# Patient Record
Sex: Female | Born: 1970 | Race: White | Hispanic: No | Marital: Married | Smoking: Former smoker
Health system: Southern US, Community
[De-identification: ages and names within clinical notes are randomized; demographics above are authoritative.]

## PROBLEM LIST (undated history)

## (undated) DIAGNOSIS — K429 Umbilical hernia without obstruction or gangrene: Secondary | ICD-10-CM

## (undated) DIAGNOSIS — F99 Mental disorder, not otherwise specified: Secondary | ICD-10-CM

## (undated) DIAGNOSIS — F419 Anxiety disorder, unspecified: Secondary | ICD-10-CM

## (undated) DIAGNOSIS — N83209 Unspecified ovarian cyst, unspecified side: Secondary | ICD-10-CM

## (undated) DIAGNOSIS — IMO0002 Reserved for concepts with insufficient information to code with codable children: Secondary | ICD-10-CM

## (undated) DIAGNOSIS — N3010 Interstitial cystitis (chronic) without hematuria: Secondary | ICD-10-CM

## (undated) DIAGNOSIS — R61 Generalized hyperhidrosis: Secondary | ICD-10-CM

## (undated) DIAGNOSIS — R87619 Unspecified abnormal cytological findings in specimens from cervix uteri: Secondary | ICD-10-CM

## (undated) HISTORY — PX: WISDOM TOOTH EXTRACTION: SHX21

## (undated) HISTORY — DX: Unspecified abnormal cytological findings in specimens from cervix uteri: R87.619

## (undated) HISTORY — DX: Reserved for concepts with insufficient information to code with codable children: IMO0002

## (undated) HISTORY — DX: Mental disorder, not otherwise specified: F99

## (undated) HISTORY — PX: LAPAROSCOPIC ENDOMETRIOSIS FULGURATION: SUR769

## (undated) HISTORY — DX: Interstitial cystitis (chronic) without hematuria: N30.10

## (undated) HISTORY — DX: Unspecified ovarian cyst, unspecified side: N83.209

## (undated) HISTORY — DX: Anxiety disorder, unspecified: F41.9

## (undated) HISTORY — DX: Umbilical hernia without obstruction or gangrene: K42.9

## (undated) HISTORY — DX: Generalized hyperhidrosis: R61

---

## 1998-06-19 ENCOUNTER — Other Ambulatory Visit: Admission: RE | Admit: 1998-06-19 | Discharge: 1998-06-19 | Payer: Self-pay | Admitting: Obstetrics and Gynecology

## 1998-12-14 DIAGNOSIS — N3010 Interstitial cystitis (chronic) without hematuria: Secondary | ICD-10-CM

## 1998-12-14 HISTORY — DX: Interstitial cystitis (chronic) without hematuria: N30.10

## 1999-09-04 ENCOUNTER — Other Ambulatory Visit: Admission: RE | Admit: 1999-09-04 | Discharge: 1999-09-04 | Payer: Self-pay | Admitting: Obstetrics and Gynecology

## 2000-09-06 ENCOUNTER — Other Ambulatory Visit: Admission: RE | Admit: 2000-09-06 | Discharge: 2000-09-06 | Payer: Self-pay | Admitting: Obstetrics and Gynecology

## 2001-03-21 ENCOUNTER — Ambulatory Visit (HOSPITAL_COMMUNITY): Admission: RE | Admit: 2001-03-21 | Discharge: 2001-03-21 | Payer: Self-pay | Admitting: Obstetrics and Gynecology

## 2001-12-14 HISTORY — PX: TUBAL LIGATION: SHX77

## 2002-04-29 ENCOUNTER — Encounter: Payer: Self-pay | Admitting: Obstetrics and Gynecology

## 2002-04-29 ENCOUNTER — Inpatient Hospital Stay: Admission: AD | Admit: 2002-04-29 | Discharge: 2002-04-29 | Payer: Self-pay | Admitting: Obstetrics and Gynecology

## 2002-05-09 ENCOUNTER — Encounter: Payer: Self-pay | Admitting: Obstetrics and Gynecology

## 2002-05-09 ENCOUNTER — Ambulatory Visit (HOSPITAL_COMMUNITY): Admission: RE | Admit: 2002-05-09 | Discharge: 2002-05-09 | Payer: Self-pay | Admitting: Obstetrics and Gynecology

## 2002-06-12 ENCOUNTER — Other Ambulatory Visit: Admission: RE | Admit: 2002-06-12 | Discharge: 2002-06-12 | Payer: Self-pay | Admitting: Obstetrics

## 2002-08-01 ENCOUNTER — Ambulatory Visit (HOSPITAL_COMMUNITY): Admission: RE | Admit: 2002-08-01 | Discharge: 2002-08-01 | Payer: Self-pay | Admitting: Obstetrics

## 2002-08-01 ENCOUNTER — Encounter: Payer: Self-pay | Admitting: Obstetrics

## 2002-09-28 ENCOUNTER — Encounter: Payer: Self-pay | Admitting: Obstetrics

## 2002-09-28 ENCOUNTER — Ambulatory Visit (HOSPITAL_COMMUNITY): Admission: RE | Admit: 2002-09-28 | Discharge: 2002-09-28 | Payer: Self-pay | Admitting: Obstetrics

## 2002-12-11 ENCOUNTER — Inpatient Hospital Stay (HOSPITAL_COMMUNITY): Admission: AD | Admit: 2002-12-11 | Discharge: 2002-12-14 | Payer: Self-pay | Admitting: Obstetrics

## 2004-09-09 ENCOUNTER — Ambulatory Visit (HOSPITAL_COMMUNITY): Admission: RE | Admit: 2004-09-09 | Discharge: 2004-09-09 | Payer: Self-pay | Admitting: Obstetrics

## 2004-09-22 ENCOUNTER — Encounter: Admission: RE | Admit: 2004-09-22 | Discharge: 2004-09-22 | Payer: Self-pay | Admitting: Obstetrics

## 2004-09-22 HISTORY — PX: BREAST BIOPSY: SHX20

## 2011-01-04 ENCOUNTER — Encounter: Payer: Self-pay | Admitting: Family Medicine

## 2011-10-12 ENCOUNTER — Other Ambulatory Visit: Payer: Self-pay | Admitting: Obstetrics and Gynecology

## 2011-10-12 DIAGNOSIS — Z1231 Encounter for screening mammogram for malignant neoplasm of breast: Secondary | ICD-10-CM

## 2011-10-13 ENCOUNTER — Encounter: Payer: Self-pay | Admitting: *Deleted

## 2011-10-13 ENCOUNTER — Ambulatory Visit (INDEPENDENT_AMBULATORY_CARE_PROVIDER_SITE_OTHER): Payer: Self-pay | Admitting: *Deleted

## 2011-10-13 ENCOUNTER — Ambulatory Visit (HOSPITAL_COMMUNITY)
Admission: RE | Admit: 2011-10-13 | Discharge: 2011-10-13 | Disposition: A | Discharge status: Home / Self Care | Payer: Self-pay | Source: Ambulatory Visit | Attending: Obstetrics and Gynecology | Admitting: Obstetrics and Gynecology

## 2011-10-13 VITALS — BP 107/59 | HR 69 | Temp 97.3°F | Ht 60.0 in | Wt 144.9 lb

## 2011-10-13 DIAGNOSIS — Z1231 Encounter for screening mammogram for malignant neoplasm of breast: Secondary | ICD-10-CM

## 2011-10-13 DIAGNOSIS — Z1239 Encounter for other screening for malignant neoplasm of breast: Secondary | ICD-10-CM

## 2011-10-13 NOTE — Progress Notes (Signed)
No complaints today.  Pap Smear:    Pap smear not performed today due to last Pap smear being less than 1 month ago. Patient had a Pap smear earlier this month at [facility]. Per patient she needs a repeat Pap smear in 3 months due to there not being enough specimen. Patient does have a history of an abnormal Pap smear around 20 years ago. Patient has a history of cryotherapy for an abnormal Pap smear. No Pap smear results in EPIC.  Physical exam: Breasts Breasts symmetrical. No skin abnormalities bilateral breasts. A small white scar the size of a pen tip on left breast at 11 o'clock where previous breast biopsy was done. No nipple retraction bilateral breasts. No nipple discharge bilateral breasts. No lymphadenopathy. No lumps palpated bilateral breasts.          Pelvic/Bimanual No Pelvic Exam completed today due to patient's last pelvic exam and Pap smear was less than 1 month ago. Not needed per BCCCP guidelines.

## 2011-10-13 NOTE — Patient Instructions (Signed)
Taught patient how to perform BSE and gave her educational materials to take home. Patient taken upstairs to mammography for a screening mammogram. Patient stated needs a repeat Pap smear in 3 months related to not enough specimen. Referred patient to our free Pap smear screening in February 2013. Gave patient dates and locations. Let patient know will follow up with her within the next couple of weeks with results. Patient verbalized understanding.

## 2011-10-22 ENCOUNTER — Encounter: Payer: Self-pay | Admitting: Obstetrics and Gynecology

## 2011-10-27 ENCOUNTER — Encounter: Payer: Self-pay | Admitting: Obstetrics and Gynecology

## 2012-01-22 ENCOUNTER — Telehealth: Payer: Self-pay | Admitting: *Deleted

## 2012-01-22 NOTE — Telephone Encounter (Signed)
Called patient in regards to needing to set up an appointment for a repeat Pap smear due to last Pap smear September 30, 2011 the endocervical/transformation zone component was absent/insufficient. Let patient know she could get the Pap smear completed at the [facility] and would set it up or that she could set the appointment up. Patient requested for me to set up the appointment. Patient stated to leave appointment on her voicemail if she did not answer phone when called back with appointment.  Called the [facility] and set patients appointment up for Friday, February 19, 2012 at 0900. Called patient back and left appointment on her voicemail with phone number if unable to keep appointment.

## 2012-02-19 ENCOUNTER — Ambulatory Visit: Payer: Self-pay | Admitting: Obstetrics and Gynecology

## 2012-03-18 ENCOUNTER — Ambulatory Visit: Payer: Self-pay | Admitting: Physician Assistant

## 2012-04-06 ENCOUNTER — Ambulatory Visit: Payer: Self-pay | Admitting: Advanced Practice Midwife

## 2012-04-27 ENCOUNTER — Ambulatory Visit (INDEPENDENT_AMBULATORY_CARE_PROVIDER_SITE_OTHER): Payer: Self-pay | Admitting: Advanced Practice Midwife

## 2012-04-27 ENCOUNTER — Encounter: Payer: Self-pay | Admitting: Advanced Practice Midwife

## 2012-04-27 VITALS — BP 119/66 | HR 79 | Temp 97.0°F | Resp 20 | Ht 61.0 in | Wt 145.0 lb

## 2012-04-27 DIAGNOSIS — R87615 Unsatisfactory cytologic smear of cervix: Secondary | ICD-10-CM | POA: Insufficient documentation

## 2012-04-27 NOTE — Patient Instructions (Signed)
Maison,  Thank you for coming in today. You will receive a call (if abnormal) or a letter (if normal) with your pap results.  If your pap is normal the new recommendation is f/u pap in 3 years.   Have a good afternoon.   Dr. Armen Pickup

## 2012-04-27 NOTE — Assessment & Plan Note (Addendum)
A: Screening pap smear done due to last pap with unsatisfactory cytology.  P: -f/u pap results. -if repeat pap is unsatisfactory the recommendation is for the patient to have a f/u colposcopy.  -follow up in 3 years, or sooner as indicated by Pap results.

## 2012-04-27 NOTE — Progress Notes (Signed)
Addended by: Lynnell Dike on: 04/27/2012 04:53 PM   Modules accepted: Orders

## 2012-04-27 NOTE — Progress Notes (Signed)
Pt had normal mammo in Oct. 2012.

## 2012-04-27 NOTE — Progress Notes (Signed)
Patient ID: Kathleen Hunter, female   DOB: 30-Sep-1971, 41 y.o.   MRN: [id number] Subjective:     Kathleen Hunter is a 41 y.o. woman who comes in today for a  pap smear only. Her most recent annual exam was on 07/2011. Her most recent Pap was done at [facility] in 07/2011 and it was an unsatisfactory sample. Previous abnormal Pap smears: yes - in her teens. Contraception: OCP (estrogen/progesterone)  Review of Systems Pertinent items are noted in HPI.   Objective:    BP 119/66  Pulse 79  Temp(Src) 97 F (36.1 C) (Oral)  Resp 20  Ht 5\' 1"  (1.549 m)  Wt 145 lb (65.772 kg)  BMI 27.40 kg/m2  LMP 04/18/2012 Pelvic Exam: cervix normal in appearance, external genitalia normal, no adnexal masses or tenderness, no cervical motion tenderness, uterus normal size, shape, and consistency and vagina normal without discharge. Pap smear obtained.   Assessment:    Screening pap smear.   Plan:    Follow up in 3 years, or as indicated by Pap results.

## 2012-05-04 ENCOUNTER — Encounter: Payer: Self-pay | Admitting: *Deleted

## 2013-03-30 ENCOUNTER — Other Ambulatory Visit: Payer: Self-pay | Admitting: Obstetrics and Gynecology

## 2013-03-30 DIAGNOSIS — Z1231 Encounter for screening mammogram for malignant neoplasm of breast: Secondary | ICD-10-CM

## 2013-04-18 ENCOUNTER — Ambulatory Visit (HOSPITAL_COMMUNITY): Payer: Self-pay

## 2013-04-18 ENCOUNTER — Ambulatory Visit (HOSPITAL_COMMUNITY): Payer: Self-pay | Attending: Obstetrics and Gynecology

## 2013-07-24 ENCOUNTER — Encounter: Payer: Self-pay | Admitting: Obstetrics & Gynecology

## 2013-09-01 ENCOUNTER — Other Ambulatory Visit: Payer: Self-pay | Admitting: *Deleted

## 2013-09-01 DIAGNOSIS — N6320 Unspecified lump in the left breast, unspecified quadrant: Secondary | ICD-10-CM

## 2013-09-05 ENCOUNTER — Ambulatory Visit (HOSPITAL_COMMUNITY)
Admission: RE | Admit: 2013-09-05 | Discharge: 2013-09-05 | Disposition: A | Discharge status: Home / Self Care | Payer: Self-pay | Source: Ambulatory Visit | Attending: Obstetrics and Gynecology | Admitting: Obstetrics and Gynecology

## 2013-09-05 ENCOUNTER — Encounter (HOSPITAL_COMMUNITY): Payer: Self-pay

## 2013-09-05 VITALS — BP 100/62 | Temp 99.0°F | Ht 61.5 in | Wt 145.4 lb

## 2013-09-05 DIAGNOSIS — Z1239 Encounter for other screening for malignant neoplasm of breast: Secondary | ICD-10-CM

## 2013-09-05 NOTE — Patient Instructions (Addendum)
Taught Ferdinand Lango how to perform BSE and gave educational materials to take home. Patient did not need a Pap smear today due to last Pap smear was 04/27/2012. Let her know BCCCP will cover Pap smears every 3 years unless has a history of abnormal Pap smears. Referred patient to the [facility] for diagnostic mammogram. Appointment scheduled for Friday, September 15, 2013 at 0915. Patient aware of appointment and will be there.  Jenetta Wease Chock verbalized understanding.  Winford Hehn, Kathaleen Maser, RN 1:11 PM

## 2013-09-05 NOTE — Progress Notes (Signed)
Complaints of left breast lump x 2 months. Complaints of left shooting breast pains that comes and goes. Patient rates pain at a 2 out of 10.  Pap Smear:    Pap smear not completed today. Last Pap smear was 04/27/2012 at [facility] and normal. Per patient has a history of an abnormal Pap smear 20 years ago that required cryo for follow up. Per patient all Pap smears have been normal since. Last Pap smear result is in EPIC.  Physical exam: Breasts Breasts symmetrical. No skin abnormalities bilateral breasts. No nipple retraction bilateral breasts. No nipple discharge bilateral breasts. No lymphadenopathy. No lumps palpated right breast. Palpated a small pea sized moveable lump within the left breast at 12 o'clock 15 cm from the nipple. Complaints of left lower breast tenderness on exam. Referred patient to the [facility] for diagnostic mammogram. Appointment scheduled for Friday, September 15, 2013 at 0915.  Pelvic/Bimanual No Pap smear completed today since last Pap smear was 04/27/2012. Pap smear not indicated per BCCCP guidelines.

## 2013-09-15 ENCOUNTER — Ambulatory Visit
Admission: RE | Admit: 2013-09-15 | Discharge: 2013-09-15 | Disposition: A | Discharge status: Home / Self Care | Payer: No Typology Code available for payment source | Source: Ambulatory Visit | Attending: Obstetrics and Gynecology | Admitting: Obstetrics and Gynecology

## 2013-09-15 DIAGNOSIS — N6320 Unspecified lump in the left breast, unspecified quadrant: Secondary | ICD-10-CM

## 2013-12-25 ENCOUNTER — Other Ambulatory Visit: Payer: Self-pay | Admitting: *Deleted

## 2013-12-25 DIAGNOSIS — IMO0001 Reserved for inherently not codable concepts without codable children: Secondary | ICD-10-CM

## 2013-12-25 MED ORDER — NORETHIN ACE-ETH ESTRAD-FE 1-20 MG-MCG(24) PO TABS: 1.0000 | ORAL_TABLET | Freq: Every day | ORAL | Status: DC

## 2013-12-27 ENCOUNTER — Other Ambulatory Visit: Payer: Self-pay | Admitting: *Deleted

## 2013-12-27 DIAGNOSIS — IMO0001 Reserved for inherently not codable concepts without codable children: Secondary | ICD-10-CM

## 2013-12-27 MED ORDER — NORETHINDRONE ACET-ETHINYL EST 1-20 MG-MCG PO TABS: 1.0000 | ORAL_TABLET | Freq: Every day | ORAL | Status: DC

## 2013-12-28 ENCOUNTER — Encounter: Payer: Self-pay | Admitting: Obstetrics & Gynecology

## 2014-01-24 ENCOUNTER — Ambulatory Visit: Payer: Self-pay | Admitting: Obstetrics

## 2014-02-20 ENCOUNTER — Encounter: Payer: Self-pay | Admitting: Obstetrics

## 2014-02-20 ENCOUNTER — Ambulatory Visit (INDEPENDENT_AMBULATORY_CARE_PROVIDER_SITE_OTHER): Payer: BC Managed Care – PPO | Admitting: Obstetrics

## 2014-02-20 VITALS — BP 117/78 | HR 74 | Temp 98.5°F | Ht 61.0 in | Wt 152.0 lb

## 2014-02-20 DIAGNOSIS — Z01419 Encounter for gynecological examination (general) (routine) without abnormal findings: Secondary | ICD-10-CM

## 2014-02-20 DIAGNOSIS — Z124 Encounter for screening for malignant neoplasm of cervix: Secondary | ICD-10-CM

## 2014-02-20 DIAGNOSIS — R109 Unspecified abdominal pain: Secondary | ICD-10-CM

## 2014-02-20 DIAGNOSIS — N949 Unspecified condition associated with female genital organs and menstrual cycle: Secondary | ICD-10-CM | POA: Insufficient documentation

## 2014-02-20 DIAGNOSIS — Z0000 Encounter for general adult medical examination without abnormal findings: Secondary | ICD-10-CM

## 2014-02-20 DIAGNOSIS — L680 Hirsutism: Secondary | ICD-10-CM

## 2014-02-20 DIAGNOSIS — N946 Dysmenorrhea, unspecified: Secondary | ICD-10-CM

## 2014-02-20 DIAGNOSIS — Z113 Encounter for screening for infections with a predominantly sexual mode of transmission: Secondary | ICD-10-CM

## 2014-02-20 DIAGNOSIS — Z3041 Encounter for surveillance of contraceptive pills: Secondary | ICD-10-CM

## 2014-02-20 LAB — CBC WITH DIFFERENTIAL/PLATELET
BASOS ABS: 0 10*3/uL (ref 0.0–0.1)
BASOS PCT: 0 % (ref 0–1)
Eosinophils Absolute: 0.1 10*3/uL (ref 0.0–0.7)
Eosinophils Relative: 1 % (ref 0–5)
HCT: 41.2 % (ref 36.0–46.0)
HEMOGLOBIN: 13.9 g/dL (ref 12.0–15.0)
Lymphocytes Relative: 40 % (ref 12–46)
Lymphs Abs: 2.1 10*3/uL (ref 0.7–4.0)
MCH: 30.3 pg (ref 26.0–34.0)
MCHC: 33.7 g/dL (ref 30.0–36.0)
MCV: 90 fL (ref 78.0–100.0)
MONOS PCT: 7 % (ref 3–12)
Monocytes Absolute: 0.4 10*3/uL (ref 0.1–1.0)
NEUTROS ABS: 2.8 10*3/uL (ref 1.7–7.7)
Neutrophils Relative %: 52 % (ref 43–77)
PLATELETS: 292 10*3/uL (ref 150–400)
RBC: 4.58 MIL/uL (ref 3.87–5.11)
RDW: 13 % (ref 11.5–15.5)
WBC: 5.3 10*3/uL (ref 4.0–10.5)

## 2014-02-20 LAB — COMPREHENSIVE METABOLIC PANEL
ALK PHOS: 43 U/L (ref 39–117)
ALT: 13 U/L (ref 0–35)
AST: 15 U/L (ref 0–37)
Albumin: 4.2 g/dL (ref 3.5–5.2)
BUN: 7 mg/dL (ref 6–23)
CO2: 29 mEq/L (ref 19–32)
Calcium: 9 mg/dL (ref 8.4–10.5)
Chloride: 104 mEq/L (ref 96–112)
Creat: 0.69 mg/dL (ref 0.50–1.10)
GLUCOSE: 75 mg/dL (ref 70–99)
Potassium: 4.1 mEq/L (ref 3.5–5.3)
SODIUM: 140 meq/L (ref 135–145)
TOTAL PROTEIN: 6.9 g/dL (ref 6.0–8.3)
Total Bilirubin: 0.4 mg/dL (ref 0.2–1.2)

## 2014-02-20 LAB — POCT URINALYSIS DIPSTICK
BILIRUBIN UA: NEGATIVE
GLUCOSE UA: NEGATIVE
Ketones, UA: NEGATIVE
Leukocytes, UA: NEGATIVE
NITRITE UA: NEGATIVE
Protein, UA: NEGATIVE
Spec Grav, UA: <=1.005
Urobilinogen, UA: NEGATIVE

## 2014-02-20 LAB — LIPID PANEL
CHOL/HDL RATIO: 3.3 ratio
CHOLESTEROL: 197 mg/dL (ref 0–200)
HDL: 59 mg/dL (ref >39)
LDL Cholesterol: 127 mg/dL — ABNORMAL HIGH (ref 0–99)
Triglycerides: 56 mg/dL (ref <150)
VLDL: 11 mg/dL (ref 0–40)

## 2014-02-20 MED ORDER — PRENATAL PLUS IRON 29-1 MG PO TABS: 1.0000 | ORAL_TABLET | Freq: Every day | ORAL | Status: DC

## 2014-02-20 MED ORDER — NORETHIN ACE-ETH ESTRAD-FE 1-20 MG-MCG PO TABS: 1.0000 | ORAL_TABLET | Freq: Every day | ORAL | Status: DC

## 2014-02-20 MED ORDER — IBUPROFEN 800 MG PO TABS: 800.0000 mg | ORAL_TABLET | Freq: Three times a day (TID) | ORAL | Status: DC | PRN

## 2014-02-20 NOTE — Progress Notes (Signed)
Subjective:     Kathleen Hunter is a 43 y.o. female here for a routine exam.  Current complaints: Patient is in the office today for an Annual Exam.  Personal health questionnaire reviewed: yes.   Gynecologic History Patient's last menstrual period was 02/12/2014. Contraception: tubal ligation/Pill  Last Pap: 2013. Results were: normal Last mammogram: 11/2013. Results were: normal  Obstetric History OB History  Gravida Para Term Preterm AB SAB TAB Ectopic Multiple Living  3 2 2  1 1    2     # Outcome Date GA Lbr Len/2nd Weight Sex Delivery Anes PTL Lv  3 TRM 12/11/02 720w0d  5 lb 11 oz (2.58 kg) M LTCS Spinal  Y  2 TRM 02/15/96 2721w0d  7 lb 14 oz (3.572 kg) M LTCS EPI  Y  1 SAB         N       The following portions of the patient's history were reviewed and updated as appropriate: allergies, current medications, past family history, past medical history, past social history, past surgical history and problem list.  Review of Systems Pertinent items are noted in HPI.    Objective:     PE:      Abdomen:  Soft, NT.         Pelvic:  NEFG.  Vagina and cervix normal.  Uterus NSSC, NT.  Adnexa negative.  Assessment:    Healthy female exam.   Pelvic pain, intermittent, RLQ.  H/O IC  Decreased libido  Hirsitism   Plan:    Follow up in: 1 year. Ultrasound ordered.   Labs drawn:  GHP, Lipid Profile, Testosterone, Vitamin D

## 2014-02-21 LAB — PAP IG W/ RFLX HPV ASCU

## 2014-02-21 LAB — TESTOSTERONE: TESTOSTERONE: 69 ng/dL (ref 10–70)

## 2014-02-21 LAB — VITAMIN D 25 HYDROXY (VIT D DEFICIENCY, FRACTURES): VIT D 25 HYDROXY: 50 ng/mL (ref 30–89)

## 2014-02-21 LAB — GC/CHLAMYDIA PROBE AMP
CT Probe RNA: NEGATIVE
GC Probe RNA: NEGATIVE

## 2014-02-21 LAB — TSH: TSH: 1.476 u[IU]/mL (ref 0.350–4.500)

## 2014-02-21 LAB — WET PREP BY MOLECULAR PROBE
Candida species: NEGATIVE
GARDNERELLA VAGINALIS: NEGATIVE
Trichomonas vaginosis: NEGATIVE

## 2014-02-22 LAB — URINE CULTURE
COLONY COUNT: NO GROWTH
ORGANISM ID, BACTERIA: NO GROWTH

## 2014-03-06 ENCOUNTER — Other Ambulatory Visit: Payer: Self-pay

## 2014-03-27 ENCOUNTER — Other Ambulatory Visit: Payer: Self-pay | Admitting: Obstetrics

## 2014-03-27 ENCOUNTER — Encounter: Payer: Self-pay | Admitting: Obstetrics

## 2014-03-27 ENCOUNTER — Ambulatory Visit (INDEPENDENT_AMBULATORY_CARE_PROVIDER_SITE_OTHER): Payer: BC Managed Care – PPO

## 2014-03-27 ENCOUNTER — Other Ambulatory Visit (INDEPENDENT_AMBULATORY_CARE_PROVIDER_SITE_OTHER): Payer: BC Managed Care – PPO

## 2014-03-27 DIAGNOSIS — R1031 Right lower quadrant pain: Secondary | ICD-10-CM

## 2014-03-27 DIAGNOSIS — R319 Hematuria, unspecified: Secondary | ICD-10-CM

## 2014-03-27 DIAGNOSIS — N390 Urinary tract infection, site not specified: Secondary | ICD-10-CM

## 2014-03-27 DIAGNOSIS — N949 Unspecified condition associated with female genital organs and menstrual cycle: Secondary | ICD-10-CM

## 2014-03-27 DIAGNOSIS — N938 Other specified abnormal uterine and vaginal bleeding: Secondary | ICD-10-CM

## 2014-03-27 DIAGNOSIS — N925 Other specified irregular menstruation: Secondary | ICD-10-CM

## 2014-03-27 LAB — POCT URINALYSIS DIPSTICK
BILIRUBIN UA: NEGATIVE
Glucose, UA: NEGATIVE
Ketones, UA: NEGATIVE
Leukocytes, UA: NEGATIVE
NITRITE UA: NEGATIVE
PH UA: 8
Protein, UA: NEGATIVE
RBC UA: 250
SPEC GRAV UA: 1.01
Urobilinogen, UA: NEGATIVE

## 2014-03-28 ENCOUNTER — Encounter: Payer: Self-pay | Admitting: Obstetrics

## 2014-03-29 ENCOUNTER — Encounter: Payer: Self-pay | Admitting: Obstetrics

## 2014-04-02 ENCOUNTER — Encounter: Payer: Self-pay | Admitting: Obstetrics

## 2014-10-15 ENCOUNTER — Encounter: Payer: Self-pay | Admitting: Obstetrics

## 2014-12-10 ENCOUNTER — Encounter: Payer: Self-pay | Admitting: *Deleted

## 2015-02-26 ENCOUNTER — Ambulatory Visit: Payer: Self-pay | Admitting: Obstetrics

## 2015-03-16 ENCOUNTER — Other Ambulatory Visit: Payer: Self-pay | Admitting: Obstetrics

## 2015-03-16 DIAGNOSIS — Z3041 Encounter for surveillance of contraceptive pills: Secondary | ICD-10-CM

## 2015-04-10 ENCOUNTER — Telehealth: Payer: Self-pay | Admitting: *Deleted

## 2015-04-10 DIAGNOSIS — Z3041 Encounter for surveillance of contraceptive pills: Secondary | ICD-10-CM

## 2015-04-10 MED ORDER — NORETHIN ACE-ETH ESTRAD-FE 1-20 MG-MCG PO TABS: ORAL_TABLET | ORAL | Status: DC

## 2015-04-10 NOTE — Telephone Encounter (Signed)
Patient has scheduled her Annual in July- needs RF on OCP.

## 2015-04-11 NOTE — Telephone Encounter (Signed)
OK 

## 2015-07-10 ENCOUNTER — Ambulatory Visit: Payer: Self-pay | Admitting: Obstetrics

## 2015-08-07 ENCOUNTER — Encounter: Payer: Self-pay | Admitting: Obstetrics

## 2015-08-07 ENCOUNTER — Ambulatory Visit (INDEPENDENT_AMBULATORY_CARE_PROVIDER_SITE_OTHER): Payer: No Typology Code available for payment source | Admitting: Obstetrics

## 2015-08-07 VITALS — BP 122/74 | HR 82 | Temp 98.1°F | Ht 61.0 in | Wt 155.0 lb

## 2015-08-07 DIAGNOSIS — Z3041 Encounter for surveillance of contraceptive pills: Secondary | ICD-10-CM

## 2015-08-07 DIAGNOSIS — R102 Pelvic and perineal pain unspecified side: Secondary | ICD-10-CM

## 2015-08-07 DIAGNOSIS — R399 Unspecified symptoms and signs involving the genitourinary system: Secondary | ICD-10-CM | POA: Diagnosis not present

## 2015-08-07 DIAGNOSIS — N946 Dysmenorrhea, unspecified: Secondary | ICD-10-CM

## 2015-08-07 DIAGNOSIS — Z01419 Encounter for gynecological examination (general) (routine) without abnormal findings: Secondary | ICD-10-CM

## 2015-08-07 DIAGNOSIS — Z124 Encounter for screening for malignant neoplasm of cervix: Secondary | ICD-10-CM

## 2015-08-07 LAB — POCT URINALYSIS DIPSTICK
BILIRUBIN UA: NEGATIVE
Blood, UA: 250
Glucose, UA: NEGATIVE
Ketones, UA: NEGATIVE
Leukocytes, UA: NEGATIVE
Nitrite, UA: NEGATIVE
PROTEIN UA: NEGATIVE
Spec Grav, UA: 1.02
Urobilinogen, UA: NEGATIVE
pH, UA: 5

## 2015-08-07 NOTE — Progress Notes (Signed)
Subjective:        Kathleen Hunter is a 44 y.o. female here for a routine exam.  Current complaints: Pelvic pain and pain with intercourse.   Personal health questionnaire:  Is patient Ashkenazi Jewish, have a family history of breast and/or ovarian cancer: no Is there a family history of uterine cancer diagnosed at age < 65, gastrointestinal cancer, urinary tract cancer, family member who is a Personnel officer syndrome-associated carrier: no Is the patient overweight and hypertensive, family history of diabetes, personal history of gestational diabetes, preeclampsia or PCOS: no Is patient over 25, have PCOS,  family history of premature CHD under age 30, diabetes, smoke, have hypertension or peripheral artery disease:  no At any time, has a partner hit, kicked or otherwise hurt or frightened you?: no Over the past 2 weeks, have you felt down, depressed or hopeless?: no Over the past 2 weeks, have you felt little interest or pleasure in doing things?:no   Gynecologic History Patient's last menstrual period was 07/07/2015. Contraception: OCP (estrogen/progesterone) Last Pap: 2015. Results were: normal Last mammogram: 2016. Results were: normal  Obstetric History OB History  Gravida Para Term Preterm AB SAB TAB Ectopic Multiple Living  # Outcome Date GA Lbr Len/2nd Weight Sex Delivery Anes PTL Lv  3 Term 12/11/02 [redacted]w[redacted]d  5 lb 11 oz (2.58 kg) M CS-LTranv Spinal  Y  2 Term 02/15/96 [redacted]w[redacted]d  7 lb 14 oz (3.572 kg) M CS-LTranv EPI  Y  1 SAB         N      Past Medical History  Diagnosis Date  . Mental disorder     anxiety  . Hyperhydrosis disorder   . Endometriosis   . Ovarian cyst   . Abnormal Pap smear     cryo therapy  . Anxiety   . Interstitial cystitis 2000  . Umbilical hernia     Past Surgical History  Procedure Laterality Date  . Laparoscopic endometriosis fulguration    . Cesarean section  1997, 2003  . Tubal ligation  2003  . Wisdom tooth extraction     age 9     Current outpatient prescriptions:  .  ibuprofen (ADVIL,MOTRIN) 800 MG tablet, Take 1 tablet (800 mg total) by mouth every 8 (eight) hours as needed., Disp: 30 tablet, Rfl: 5 .  Meth-Hyo-M Bl-Na Phos-Ph Sal (URIBEL PO), Take by mouth 2 (two) times daily as needed.  , Disp: , Rfl:  .  norethindrone-ethinyl estradiol (JUNEL FE 1/20) 1-20 MG-MCG tablet, TAKE ONE (1) TABLET EACH DAY, Disp: 1 Package, Rfl: 3 .  Prenatal Vit-Iron Carbonyl-FA (PRENATAL PLUS IRON) 29-1 MG TABS, Take 1 tablet by mouth daily before breakfast., Disp: 30 tablet, Rfl: 11 .  ALPRAZolam (XANAX) 0.5 MG tablet, Take 0.5 mg by mouth 2 (two) times daily as needed.  , Disp: , Rfl:  .  [DISCONTINUED] norethindrone-ethinyl estradiol (LOESTRIN 1/20, 21,) 1-20 MG-MCG tablet, Take 1 tablet by mouth daily., Disp: 1 Package, Rfl: 2 Allergies  Allergen Reactions  . Sulfa Antibiotics Rash  . Darvocet [Propoxyphene N-Acetaminophen]     Social History  Substance Use Topics  . Smoking status: Former Smoker    Types: Cigarettes    Quit date: 11/13/2002  . Smokeless tobacco: Never Used  . Alcohol Use: Yes     Comment: social    Family History  Problem Relation Age of Onset  . Thyroid disease Father   .  COPD Father   . Hypertension Father   . Hyperlipidemia Father   . Hypertension Mother   . Hyperlipidemia Mother   . Thyroid disease Mother   . Uterine cancer Mother   . Hypertension Sister   . Cancer Cousin 30    cervical      Review of Systems  Constitutional: negative for fatigue and weight loss Respiratory: negative for cough and wheezing Cardiovascular: negative for chest pain, fatigue and palpitations Gastrointestinal: negative for abdominal pain and change in bowel habits Musculoskeletal:negative for myalgias Neurological: negative for gait problems and tremors Behavioral/Psych: negative for abusive relationship, depression Endocrine: negative for temperature intolerance   Genitourinary:negative for  abnormal menstrual periods, genital lesions, hot flashes, sexual problems and vaginal discharge.  Positive for pelvic pain Integument/breast: negative for breast lump, breast tenderness, nipple discharge and skin lesion(s)    Objective:       BP 122/74 mmHg  Pulse 82  Temp(Src) 98.1 F (36.7 C)  Ht 5\' 1"  (1.549 m)  Wt 155 lb (70.308 kg)  BMI 29.30 kg/m2  LMP 07/07/2015 General:   alert  Skin:   no rash or abnormalities  Lungs:   clear to auscultation bilaterally  Heart:   regular rate and rhythm, S1, S2 normal, no murmur, click, rub or gallop  Breasts:   normal without suspicious masses, skin or nipple changes or axillary nodes  Abdomen:  normal findings: no organomegaly, soft, non-tender and no hernia  Pelvis:  External genitalia: normal general appearance Urinary system: urethral meatus normal and bladder without fullness, nontender Vaginal: normal without tenderness, induration or masses Cervix: normal appearance Adnexa: normal bimanual exam Uterus: anteverted and non-tender, normal size   Lab Review Urine pregnancy test Labs reviewed yes Radiologic studies reviewed yes    Assessment:    Healthy female exam.    Pelvic pain  Contraceptive management   Plan:   Ultrasound ordered   Education reviewed: calcium supplements, depression evaluation, low fat, low cholesterol diet, safe sex/STD prevention and self breast exams. Contraception: OCP (estrogen/progesterone). Follow up in: 1 year.   No orders of the defined types were placed in this encounter.   Orders Placed This Encounter  Procedures  . SureSwab, Vaginosis/Vaginitis Plus  . POCT urinalysis dipstick

## 2015-08-08 ENCOUNTER — Telehealth: Payer: Self-pay | Admitting: *Deleted

## 2015-08-08 MED ORDER — PRENATAL PLUS IRON 29-1 MG PO TABS: 1.0000 | ORAL_TABLET | Freq: Every day | ORAL | Status: AC

## 2015-08-08 MED ORDER — NORETHIN ACE-ETH ESTRAD-FE 1-20 MG-MCG PO TABS: ORAL_TABLET | ORAL | Status: DC

## 2015-08-08 MED ORDER — IBUPROFEN 800 MG PO TABS: 800.0000 mg | ORAL_TABLET | Freq: Three times a day (TID) | ORAL | Status: DC | PRN

## 2015-08-08 NOTE — Telephone Encounter (Signed)
Patient is calling for her lab results. 2:16 LM on VM- UA normal  Wet prep and pap usually take 48 hours- so check back tomorrow or Monday for those results.

## 2015-08-08 NOTE — Addendum Note (Signed)
Addended by: Elby Beck F on: 08/08/2015 05:13 PM   Modules accepted: Orders

## 2015-08-09 LAB — URINE CULTURE: Colony Count: 2000

## 2015-08-10 LAB — PAP, TP IMAGING W/ HPV RNA, RFLX HPV TYPE 16,18/45: HPV mRNA, High Risk: NOT DETECTED

## 2015-08-11 LAB — SURESWAB, VAGINOSIS/VAGINITIS PLUS
Atopobium vaginae: NOT DETECTED Log (cells/mL)
C. ALBICANS, DNA: NOT DETECTED
C. PARAPSILOSIS, DNA: NOT DETECTED
C. TROPICALIS, DNA: NOT DETECTED
C. glabrata, DNA: NOT DETECTED
C. trachomatis RNA, TMA: NOT DETECTED
Gardnerella vaginalis: NOT DETECTED Log (cells/mL)
LACTOBACILLUS SPECIES: 7.6 Log (cells/mL)
MEGASPHAERA SPECIES: NOT DETECTED Log (cells/mL)
N. gonorrhoeae RNA, TMA: NOT DETECTED
T. VAGINALIS RNA, QL TMA: NOT DETECTED

## 2015-08-13 ENCOUNTER — Telehealth: Payer: Self-pay | Admitting: *Deleted

## 2015-08-13 ENCOUNTER — Other Ambulatory Visit: Payer: Self-pay | Admitting: Obstetrics

## 2015-08-13 DIAGNOSIS — R102 Pelvic and perineal pain: Secondary | ICD-10-CM

## 2015-08-13 DIAGNOSIS — Z3041 Encounter for surveillance of contraceptive pills: Secondary | ICD-10-CM

## 2015-08-13 MED ORDER — NORETHIN ACE-ETH ESTRAD-FE 1.5-30 MG-MCG PO TABS: 1.0000 | ORAL_TABLET | Freq: Every day | ORAL | Status: DC

## 2015-08-13 NOTE — Telephone Encounter (Signed)
Patient calling for her results- results reviewed. Patient c/o hair growth on face increasing over the past year and changes in her cycle- wants to know if we can increase her OCP dosing. Will check with provider. Also wants to know about the [place] to be scheduled for her R tenderness ? Cyst.

## 2015-08-16 ENCOUNTER — Ambulatory Visit (HOSPITAL_COMMUNITY): Payer: Self-pay

## 2015-08-26 ENCOUNTER — Ambulatory Visit (HOSPITAL_COMMUNITY)
Admission: RE | Admit: 2015-08-26 | Discharge: 2015-08-26 | Disposition: A | Discharge status: Home / Self Care | Payer: No Typology Code available for payment source | Source: Ambulatory Visit | Attending: Obstetrics | Admitting: Obstetrics

## 2015-08-26 DIAGNOSIS — R102 Pelvic and perineal pain: Secondary | ICD-10-CM | POA: Diagnosis present

## 2015-08-26 DIAGNOSIS — N946 Dysmenorrhea, unspecified: Secondary | ICD-10-CM | POA: Diagnosis not present

## 2015-08-26 DIAGNOSIS — N809 Endometriosis, unspecified: Secondary | ICD-10-CM | POA: Diagnosis not present

## 2015-08-29 ENCOUNTER — Telehealth: Payer: Self-pay | Admitting: *Deleted

## 2015-08-29 NOTE — Telephone Encounter (Signed)
Patient is calling for her [place] results. 9/15 9:24  Patient is calling for her [place] results- patient notified I will have Dr Clearance Coots review for her. Patient states that during her [place] her pain was more intense during the vaginal probe forward- upward pressure rather than the downward back pressure. Told patient she could let Dr Clearance Coots know and she and that information would be helpful to him.

## 2016-04-01 ENCOUNTER — Ambulatory Visit: Payer: No Typology Code available for payment source | Admitting: Obstetrics

## 2016-07-27 ENCOUNTER — Other Ambulatory Visit: Payer: Self-pay | Admitting: *Deleted

## 2016-07-27 ENCOUNTER — Other Ambulatory Visit: Payer: Self-pay | Admitting: Obstetrics

## 2016-07-27 DIAGNOSIS — Z3041 Encounter for surveillance of contraceptive pills: Secondary | ICD-10-CM

## 2016-07-27 MED ORDER — NORETHIN ACE-ETH ESTRAD-FE 1.5-30 MG-MCG PO TABS: 1.0000 | ORAL_TABLET | Freq: Every day | ORAL | 11 refills | Status: DC

## 2016-08-12 ENCOUNTER — Ambulatory Visit: Payer: No Typology Code available for payment source | Admitting: Obstetrics

## 2017-04-14 ENCOUNTER — Ambulatory Visit (INDEPENDENT_AMBULATORY_CARE_PROVIDER_SITE_OTHER): Payer: 59 | Admitting: Obstetrics

## 2017-04-14 ENCOUNTER — Encounter: Payer: Self-pay | Admitting: Obstetrics

## 2017-04-14 VITALS — BP 109/63 | HR 73 | Wt 154.4 lb

## 2017-04-14 DIAGNOSIS — Z113 Encounter for screening for infections with a predominantly sexual mode of transmission: Secondary | ICD-10-CM | POA: Diagnosis not present

## 2017-04-14 DIAGNOSIS — N644 Mastodynia: Secondary | ICD-10-CM

## 2017-04-14 DIAGNOSIS — Z1151 Encounter for screening for human papillomavirus (HPV): Secondary | ICD-10-CM

## 2017-04-14 DIAGNOSIS — Z01419 Encounter for gynecological examination (general) (routine) without abnormal findings: Secondary | ICD-10-CM

## 2017-04-14 DIAGNOSIS — R102 Pelvic and perineal pain: Secondary | ICD-10-CM

## 2017-04-14 DIAGNOSIS — N393 Stress incontinence (female) (male): Secondary | ICD-10-CM

## 2017-04-14 DIAGNOSIS — Z8742 Personal history of other diseases of the female genital tract: Secondary | ICD-10-CM

## 2017-04-14 DIAGNOSIS — Z124 Encounter for screening for malignant neoplasm of cervix: Secondary | ICD-10-CM

## 2017-04-14 DIAGNOSIS — G8929 Other chronic pain: Secondary | ICD-10-CM

## 2017-04-14 DIAGNOSIS — Z3041 Encounter for surveillance of contraceptive pills: Secondary | ICD-10-CM

## 2017-04-14 DIAGNOSIS — N898 Other specified noninflammatory disorders of vagina: Secondary | ICD-10-CM | POA: Diagnosis not present

## 2017-04-14 DIAGNOSIS — N946 Dysmenorrhea, unspecified: Secondary | ICD-10-CM

## 2017-04-14 MED ORDER — IBUPROFEN 800 MG PO TABS: 800.0000 mg | ORAL_TABLET | Freq: Three times a day (TID) | ORAL | 5 refills | Status: AC | PRN

## 2017-04-14 MED ORDER — NORETHIN ACE-ETH ESTRAD-FE 1.5-30 MG-MCG PO TABS: 1.0000 | ORAL_TABLET | Freq: Every day | ORAL | 11 refills | Status: DC

## 2017-04-14 NOTE — Progress Notes (Signed)
Patient is in the office for annual exam, reports ocassional pelvic pressure.

## 2017-04-14 NOTE — Progress Notes (Addendum)
Subjective:        Kathleen Hunter is a 46 y.o. female here for a routine exam. 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 Patient complain of lower pelvic pressure, dyspareunia, and lower back pain, Patient taking microgrogestin,  On 21 day pack, cycle lasting 3 days with light to moderate bleeding, Currently followed by urologist for IC . Patient does report dysuria, urgency and incontinence with cough and sneezing at times. Patient has breast tenderness on left and right side and does not generally do self breast exams.  Personal health questionnaire:  Is patient Ashkenazi Jewish, have a family history of breast and/or ovarian cancer: yes , mom had cervical cancer Is there a family history of uterine cancer diagnosed at age < 21, gastrointestinal cancer, urinary tract cancer, family member who is a Personnel officer syndrome-associated carrier: no  Is the patient overweight and hypertensive, family history of diabetes, personal history of gestational diabetes, preeclampsia or PCOS: no    Is patient over 8, have PCOS,  family history of premature CHD under age 70, diabetes, smoke, have hypertension or peripheral artery disease:  no At any time, has a partner hit, kicked or otherwise hurt or frightened you?: no Over the past 2 weeks, have you felt down, depressed or hopeless?: no Over the past 2 weeks, have you felt little interest or pleasure in doing things?:no   Gynecologic History Patient's last menstrual period was 03/21/2017. Contraception: OCP (estrogen/progesterone) Microgestin Last Pap: 07/2015. Results were: normal Last mammogram: 2015. Results were: normal  Obstetric History OB History  Gravida Para Term Preterm AB Living  SAB TAB Ectopic Multiple Live Births  1       2    # Outcome Date GA Lbr Len/2nd Weight Sex Delivery Anes  PTL Lv  3 Term 12/11/02 [redacted]w[redacted]d  5 lb 11 oz (2.58 kg) M CS-LTranv Spinal  LIV  2 Term 02/15/96 [redacted]w[redacted]d  7 lb 14 oz (3.572 kg) M CS-LTranv EPI  LIV  1 SAB         DEC      Past Medical History:  Diagnosis Date  . Abnormal Pap smear    cryo therapy  . Anxiety   . Endometriosis   . Hyperhydrosis disorder   . Interstitial cystitis 2000  . Mental disorder    anxiety  . Ovarian cyst   . Umbilical hernia     Past Surgical History:  Procedure Laterality Date  . CESAREAN SECTION  1997, 2003  . LAPAROSCOPIC ENDOMETRIOSIS FULGURATION    . TUBAL LIGATION  2003  . WISDOM TOOTH EXTRACTION     age 72     Current Outpatient Prescriptions:  .  b complex vitamins capsule, Take 1 capsule by mouth daily., Disp: , Rfl:  .  Meth-Hyo-M Bl-Na Phos-Ph Sal (URIBEL PO), Take by mouth 2 (two) times daily as needed.  , Disp: , Rfl:  .  norethindrone-ethinyl estradiol-iron (MICROGESTIN FE,GILDESS FE,LOESTRIN FE) 1.5-30 MG-MCG tablet, Take 1 tablet by mouth daily., Disp: 1 Package, Rfl: 11 .  Omega-3 Fatty Acids (FISH OIL) 1000 MG CAPS, Take by mouth., Disp: , Rfl:  .  ALPRAZolam (XANAX) 0.5 MG tablet, Take 0.5 mg by mouth 2 (two) times daily as needed.  , Disp: , Rfl:  .  ibuprofen (ADVIL,MOTRIN) 800 MG tablet, Take 1 tablet (800 mg total) by  mouth every 8 (eight) hours as needed., Disp: 30 tablet, Rfl: 5 .  Prenatal Vit-Iron Carbonyl-FA (PRENATAL PLUS IRON) 29-1 MG TABS, Take 1 tablet by mouth daily before breakfast., Disp: 30 tablet, Rfl: 11 Allergies  Allergen Reactions  . Sulfa Antibiotics Rash  . Darvocet [Propoxyphene N-Acetaminophen]     Social History  Substance Use Topics  . Smoking status: Former Smoker    Types: Cigarettes    Quit date: 11/13/2002  . Smokeless tobacco: Never Used  . Alcohol use Yes     Comment: social    Family History  Problem Relation Age of Onset  . Thyroid disease Father   . COPD Father   . Hypertension Father   . Hyperlipidemia Father   . Hypertension Mother    . Hyperlipidemia Mother   . Thyroid disease Mother   . Uterine cancer Mother   . Hypertension Sister   . Cancer Cousin 30    cervical      Review of Systems  Constitutional: negative for fatigue and weight loss Respiratory: negative for cough and wheezing Cardiovascular: negative for chest pain, fatigue and palpitations Gastrointestinal: negative for abdominal pain and change in bowel habits Musculoskeletal:negative for myalgias Neurological: negative for gait problems and tremors Behavioral/Psych: negative for abusive relationship, depression Endocrine: negative for temperature intolerance    Genitourinary:negative for abnormal menstrual periods, genital lesions, hot flashes, sexual problems and vaginal discharge.  Positive for painful intercourse and leaking of urine with cough, sneeze, etc. Integument/breast: positive for bilateral breast tenderness    Objective:       BP 109/63   Pulse 73   Wt 154 lb 6.4 oz (70 kg)   LMP 03/21/2017   BMI 29.17 kg/m  General:   alert  Skin:   no rash or abnormalities  Lungs:   clear to auscultation bilaterally  Heart:   regular rate and rhythm, S1, S2 normal, no murmur, click, rub or gallop  Breasts:   normal without suspicious masses, skin or nipple changes or axillary nodes  Abdomen:  normal findings: no organomegaly, soft, tender and no hernia  Pelvis:  External genitalia: normal general appearance, tender Urinary system: urethral meatus normal and bladder without fullness, nontender Vaginal: normal without tenderness, induration or masses Cervix: normal appearance Adnexa: normal bimanual exam Uterus: anteverted and non-tender, normal size   Lab Review Urine pregnancy test Labs reviewed yes Radiologic studies reviewed yes  50% of 30 min visit spent on counseling and coordination of care.    Assessment:    Healthy female exam. Pap smear today,pelvic pain  Chronic Pelvic Pain  History of Laparoscopic diagnosed  Endometriosis  SUI  Mastodynia - Bilateral    Plan:  Encounter for gynecological examination with Papanicolaou smear of cervix - Plan: Cytology - PAP  Chronic pelvic pain in female - Plan: Ambulatory referral to Urogynecology  History of endometriosis - Plan: Ambulatory referral to Urogynecology  SUI (stress urinary incontinence, female) - Plan: Ambulatory referral to Urogynecology  Vaginal discharge - Plan: Cervicovaginal ancillary only  Dysmenorrhea - Plan: ibuprofen (ADVIL,MOTRIN) 800 MG tablet  Encounter for surveillance of contraceptive pills - Plan: norethindrone-ethinyl estradiol-iron (MICROGESTIN FE,GILDESS FE,LOESTRIN FE) 1.5-30 MG-MCG tablet    Education reviewed: calcium supplements, depression evaluation, low fat, low cholesterol diet, safe sex/STD prevention and self breast exams. Contraception: OCP (estrogen/progesterone). Mammogram ordered. Follow up in: 1 year.   Meds ordered this encounter  Medications  . Omega-3 Fatty Acids (FISH OIL) 1000 MG CAPS    Sig: Take by mouth.  Marland Kitchen b complex vitamins capsule    Sig: Take 1 capsule by mouth daily.  Marland Kitchen ibuprofen (ADVIL,MOTRIN) 800 MG tablet    Sig: Take 1 tablet (800 mg total) by mouth every 8 (eight) hours as needed.    Dispense:  30 tablet    Refill:  5  . norethindrone-ethinyl estradiol-iron (MICROGESTIN FE,GILDESS FE,LOESTRIN FE) 1.5-30 MG-MCG tablet    Sig: Take 1 tablet by mouth daily.    Dispense:  1 Package    Refill:  11   Possible management options include:  Referral to Dr. Ashley Royalty urogynecologist considering history and pelvic discomort Follow up as needed.  Charles A. Clearance Coots MD

## 2017-04-15 LAB — CERVICOVAGINAL ANCILLARY ONLY
Bacterial vaginitis: NEGATIVE
CANDIDA VAGINITIS: NEGATIVE

## 2017-04-15 NOTE — Addendum Note (Signed)
Addended by: Coral CeoHARPER, Romelle Reiley A on: 04/15/2017 12:50 PM   Modules accepted: Orders

## 2017-04-19 ENCOUNTER — Other Ambulatory Visit: Payer: Self-pay | Admitting: Obstetrics

## 2017-04-19 ENCOUNTER — Other Ambulatory Visit: Payer: Self-pay

## 2017-04-19 DIAGNOSIS — N644 Mastodynia: Secondary | ICD-10-CM

## 2017-04-19 LAB — CYTOLOGY - PAP
Diagnosis: NEGATIVE
HPV: NOT DETECTED

## 2017-04-23 ENCOUNTER — Other Ambulatory Visit: Payer: 59

## 2017-04-28 ENCOUNTER — Ambulatory Visit
Admission: RE | Admit: 2017-04-28 | Discharge: 2017-04-28 | Disposition: A | Discharge status: Home / Self Care | Payer: BLUE CROSS/BLUE SHIELD | Source: Ambulatory Visit | Attending: Obstetrics | Admitting: Obstetrics

## 2017-04-28 ENCOUNTER — Other Ambulatory Visit: Payer: Self-pay | Admitting: Obstetrics

## 2017-04-28 DIAGNOSIS — Z1239 Encounter for other screening for malignant neoplasm of breast: Secondary | ICD-10-CM

## 2017-04-28 DIAGNOSIS — N644 Mastodynia: Secondary | ICD-10-CM

## 2017-04-29 ENCOUNTER — Telehealth: Payer: Self-pay | Admitting: *Deleted

## 2017-04-29 NOTE — Telephone Encounter (Signed)
Pt called to office for recent results.  Spoke with pt. Made aware of normal lab results.

## 2017-04-30 ENCOUNTER — Other Ambulatory Visit: Payer: Self-pay | Admitting: Obstetrics

## 2017-04-30 DIAGNOSIS — R928 Other abnormal and inconclusive findings on diagnostic imaging of breast: Secondary | ICD-10-CM

## 2017-05-05 ENCOUNTER — Ambulatory Visit
Admission: RE | Admit: 2017-05-05 | Discharge: 2017-05-05 | Disposition: A | Discharge status: Home / Self Care | Payer: BLUE CROSS/BLUE SHIELD | Source: Ambulatory Visit | Attending: Obstetrics | Admitting: Obstetrics

## 2017-05-05 DIAGNOSIS — R928 Other abnormal and inconclusive findings on diagnostic imaging of breast: Secondary | ICD-10-CM

## 2017-10-18 ENCOUNTER — Encounter (HOSPITAL_COMMUNITY): Payer: Self-pay

## 2017-10-19 ENCOUNTER — Encounter (HOSPITAL_COMMUNITY): Payer: Self-pay

## 2017-11-10 ENCOUNTER — Encounter (HOSPITAL_COMMUNITY): Payer: Self-pay

## 2018-05-11 ENCOUNTER — Other Ambulatory Visit: Payer: Self-pay | Admitting: Obstetrics

## 2018-05-11 DIAGNOSIS — Z3041 Encounter for surveillance of contraceptive pills: Secondary | ICD-10-CM

## 2018-09-10 IMAGING — MG 2D DIGITAL SCREENING BILATERAL MAMMOGRAM WITH CAD AND ADJUNCT TO
8 of 12 series · 8 of 28 positions shown · non-contrast
Comparison: Previous exam(s).

CLINICAL DATA: Screening.

EXAM:
2D DIGITAL SCREENING BILATERAL MAMMOGRAM WITH CAD AND ADJUNCT TOMO

[R MLO synth-2D]
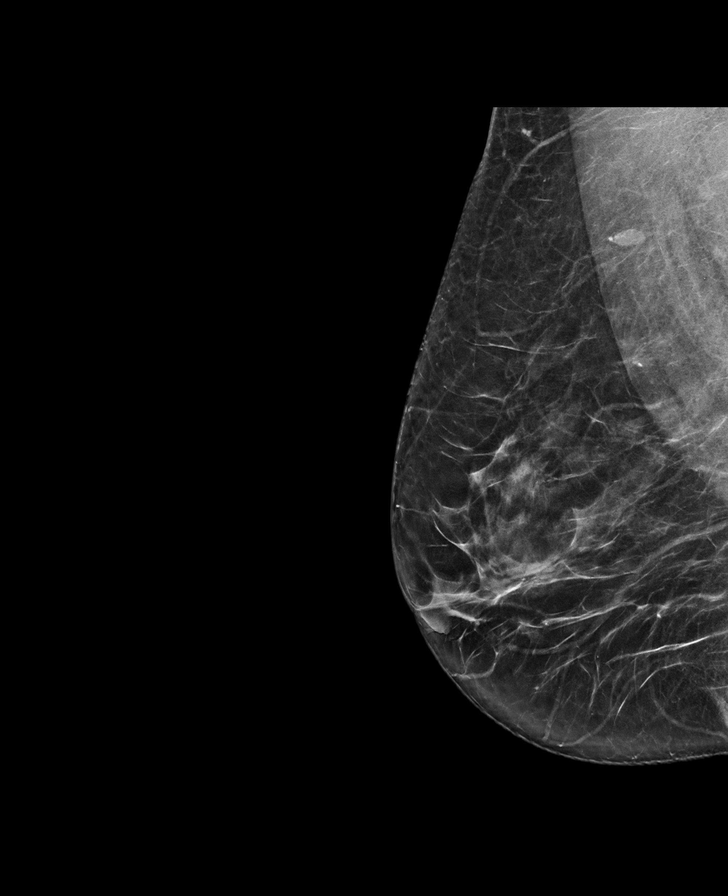

[L MLO synth-2D]
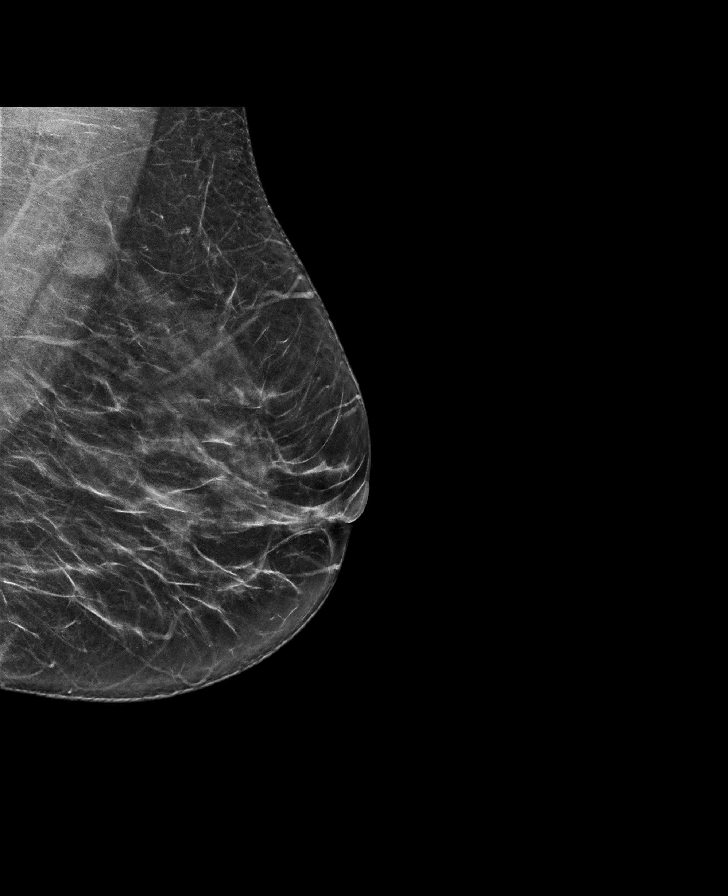

[L CC synth-2D]
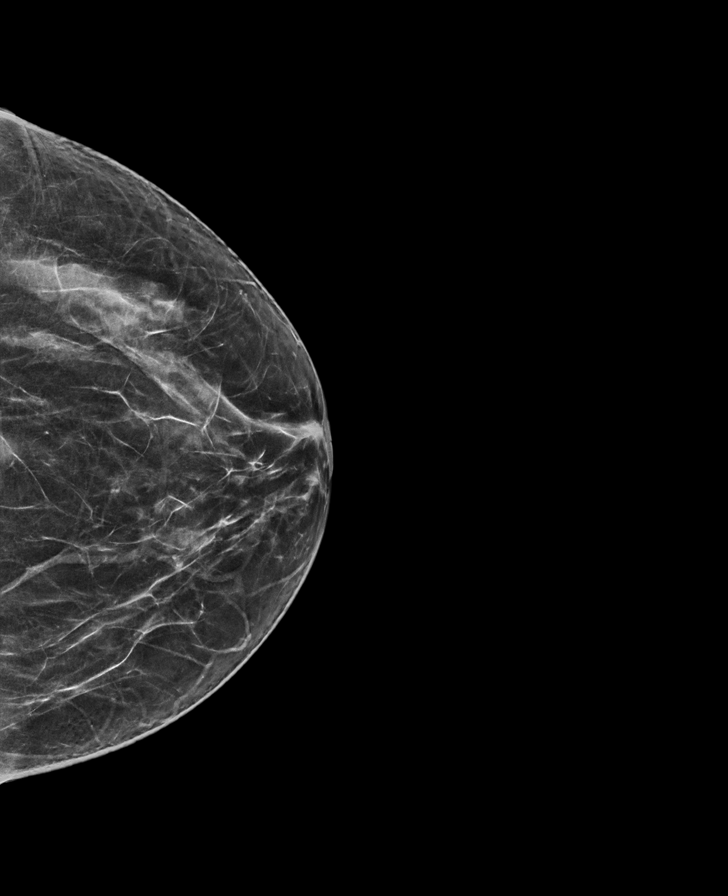

[R CC synth-2D]
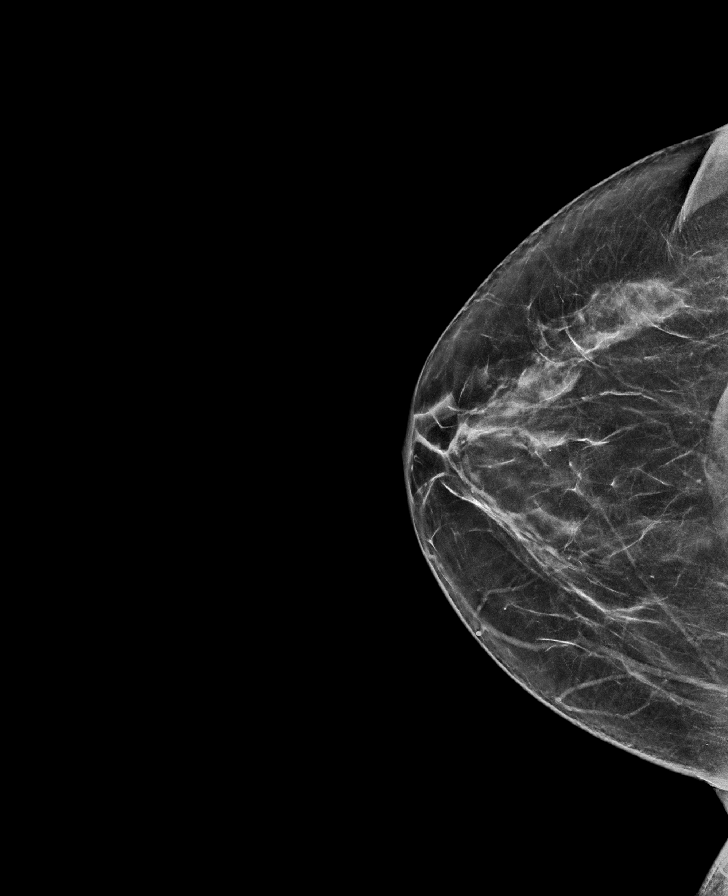

[L MLO]
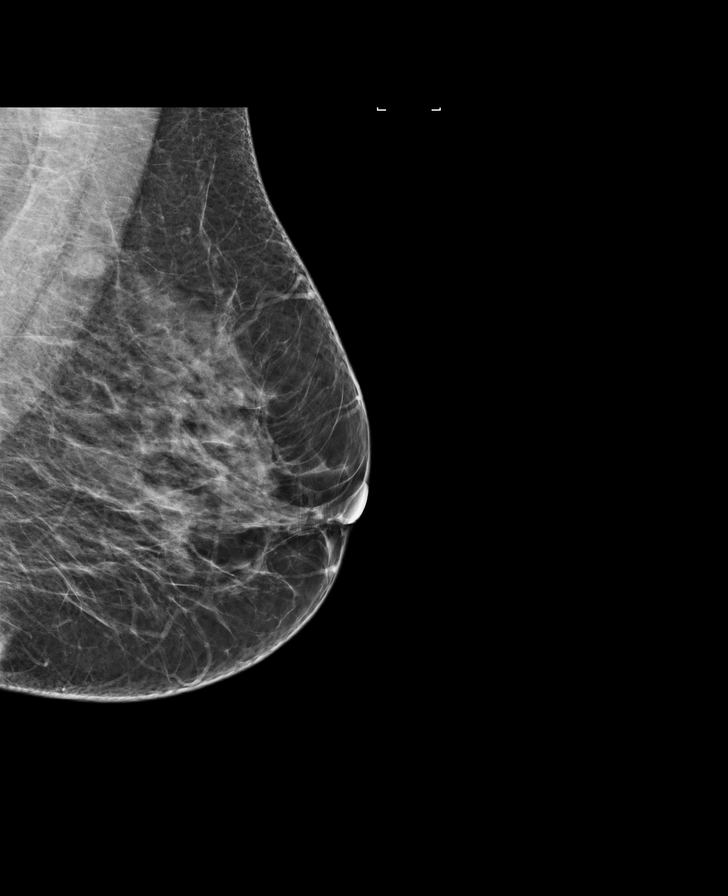

[L CC]
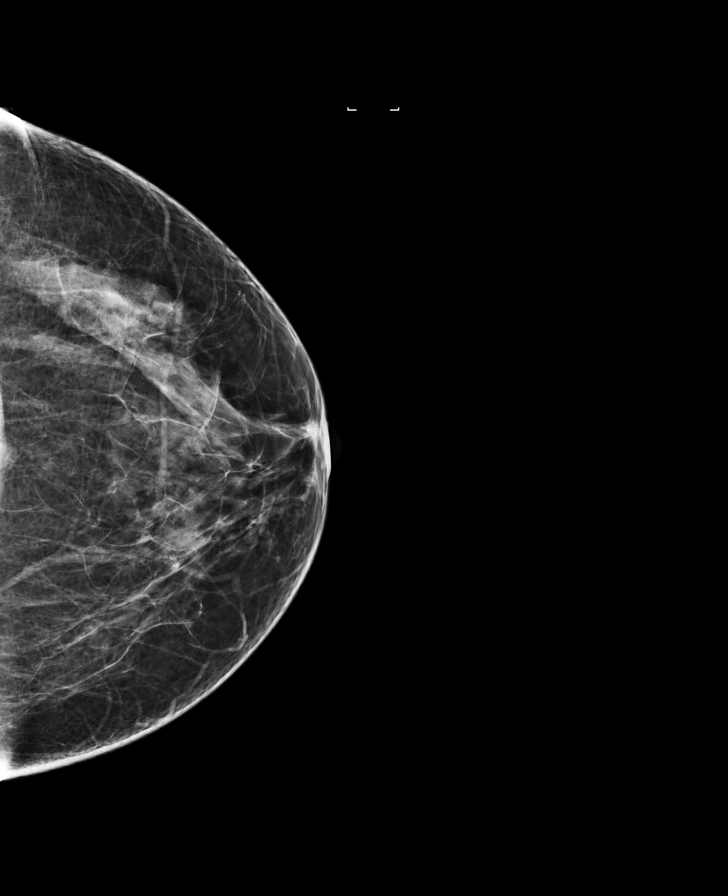

[R CC]
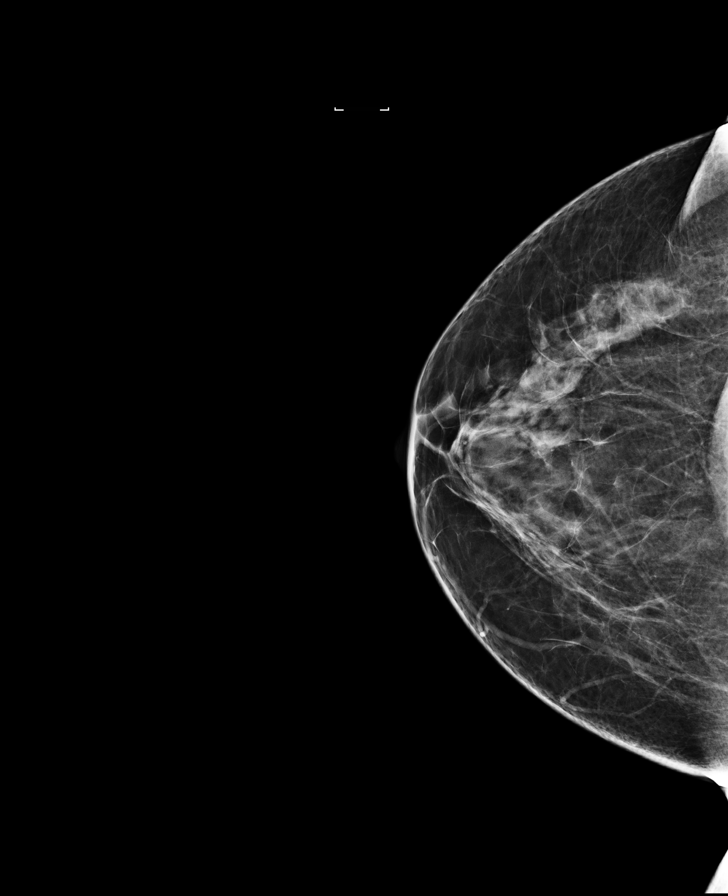

[R MLO]
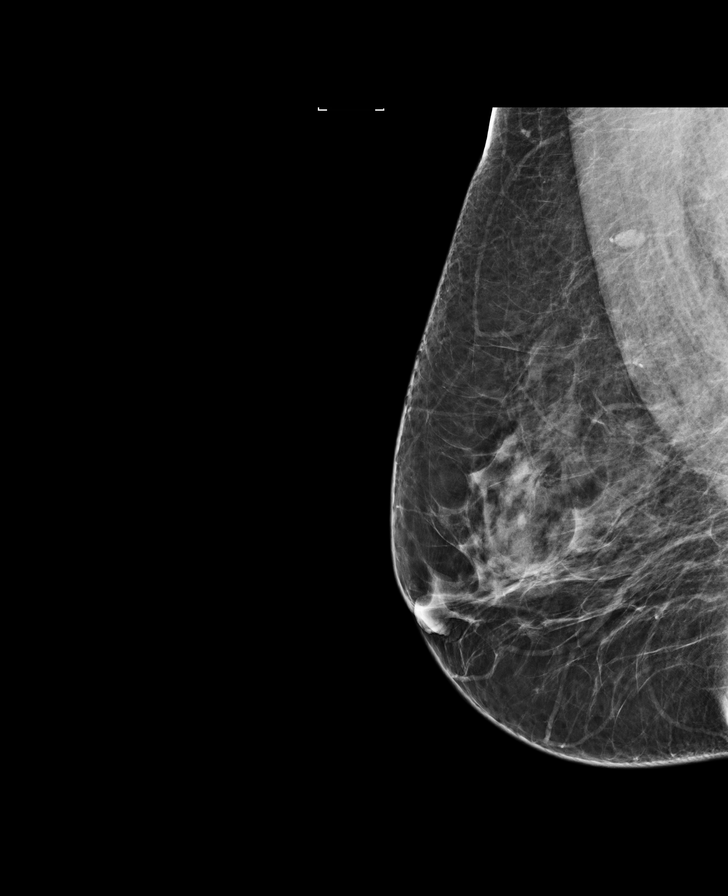

[8 of 28 positions shown; findings below may reference images not displayed]

ACR Breast Density Category b: There are scattered areas of
fibroglandular density.
FINDINGS: In the left breast, a possible mass warrants further evaluation. In
the right breast, no findings suspicious for malignancy.

Images were processed with CAD.
IMPRESSION: Further evaluation is suggested for possible mass in the left
breast.

RECOMMENDATION:
Diagnostic mammogram and possibly ultrasound of the left breast.
(Code:KJ-[id number])

The patient will be contacted regarding the findings, and additional
imaging will be scheduled.

BI-RADS CATEGORY  0: Incomplete. Need additional imaging evaluation
and/or prior mammograms for comparison.

## 2018-09-17 IMAGING — US ULTRASOUND LEFT BREAST LIMITED
1 series · 7 of 7 positions shown · non-contrast
Comparison: 04/28/2017 and earlier

CLINICAL DATA: Patient returns after screening mammogram for
evaluation of possible left breast mass. In [id number] patient had
ultrasound-guided core biopsy of a mass in the 11 o'clock location
of the left breast measuring 1.3 x 0.6 x 1.2 cm. Biopsy revealed
benign fibroadenoma. Images from that exam are not available.

EXAM:
2D DIGITAL DIAGNOSTIC LEFT MAMMOGRAM WITH CAD AND ADJUNCT TOMO
ULTRASOUND LEFT BREAST

[Series 1: ultrasound left breast limited · 0.06mm/px · 7 of 7 slices shown]
[im 1/7]
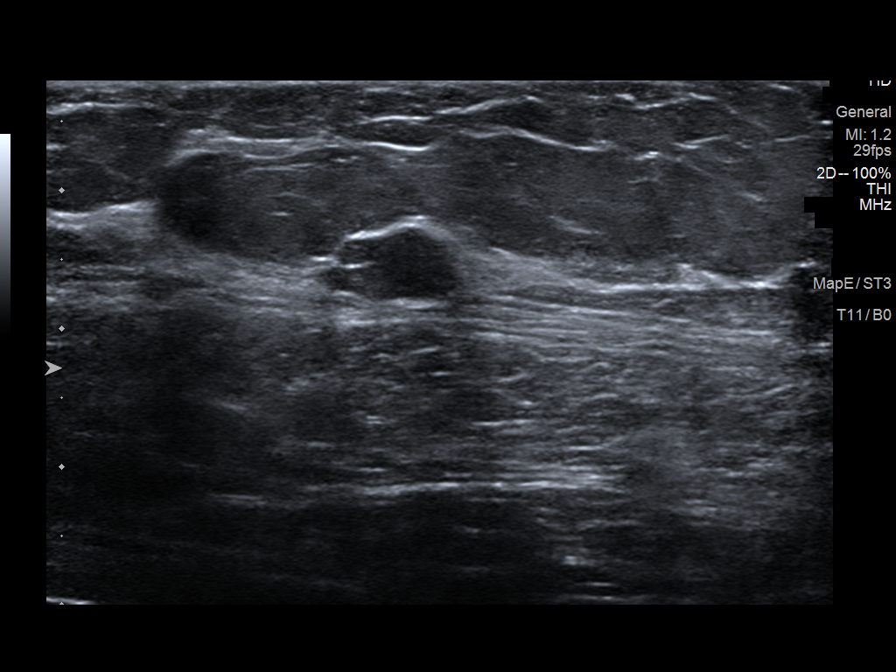
[im 2/7]
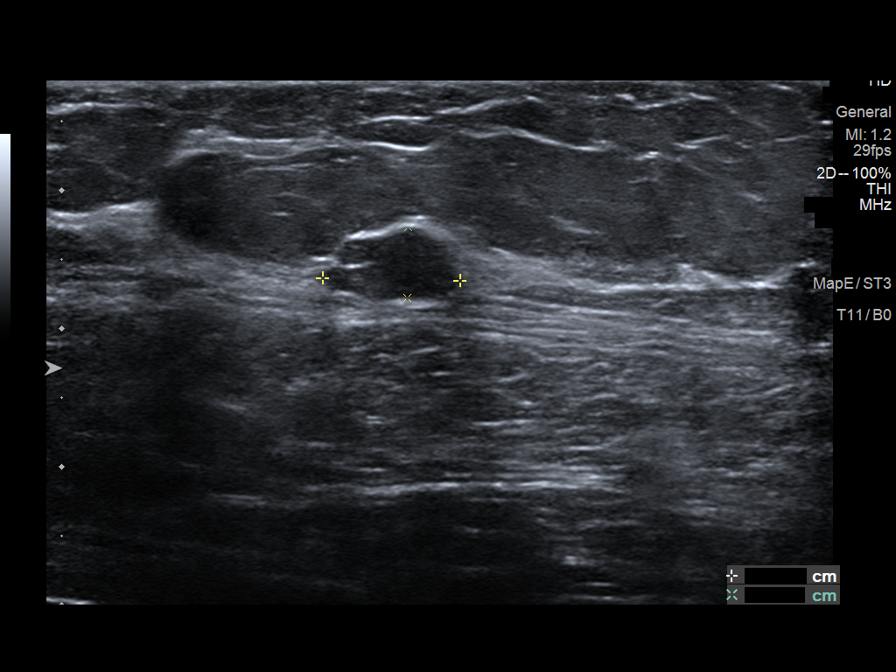
[im 3/7]
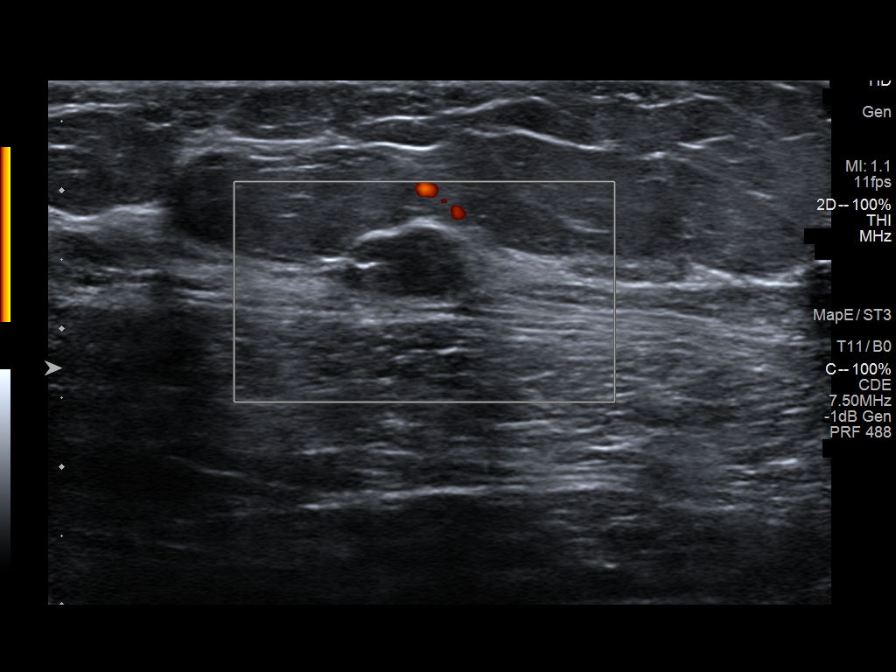
[im 4/7]
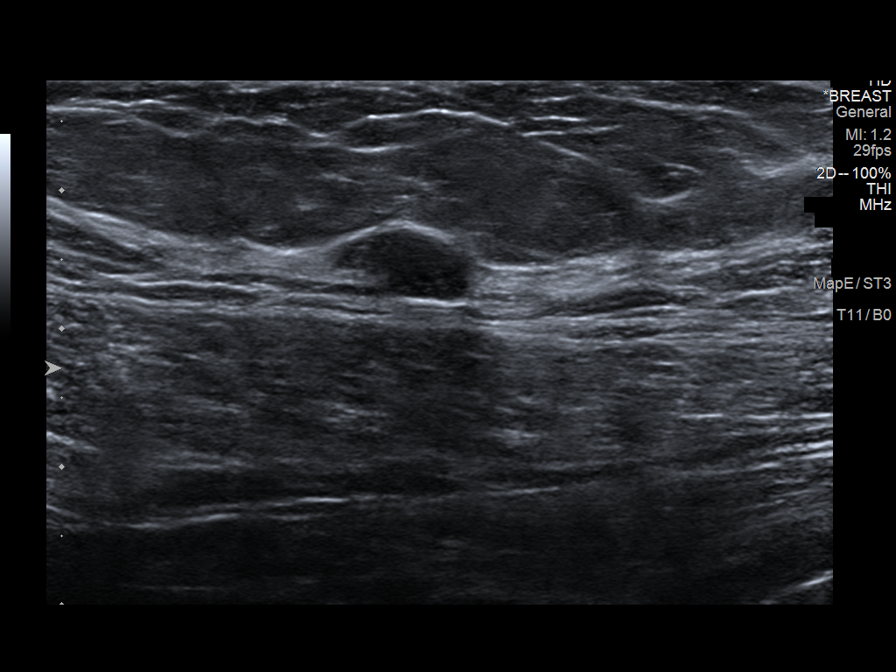
[im 5/7]
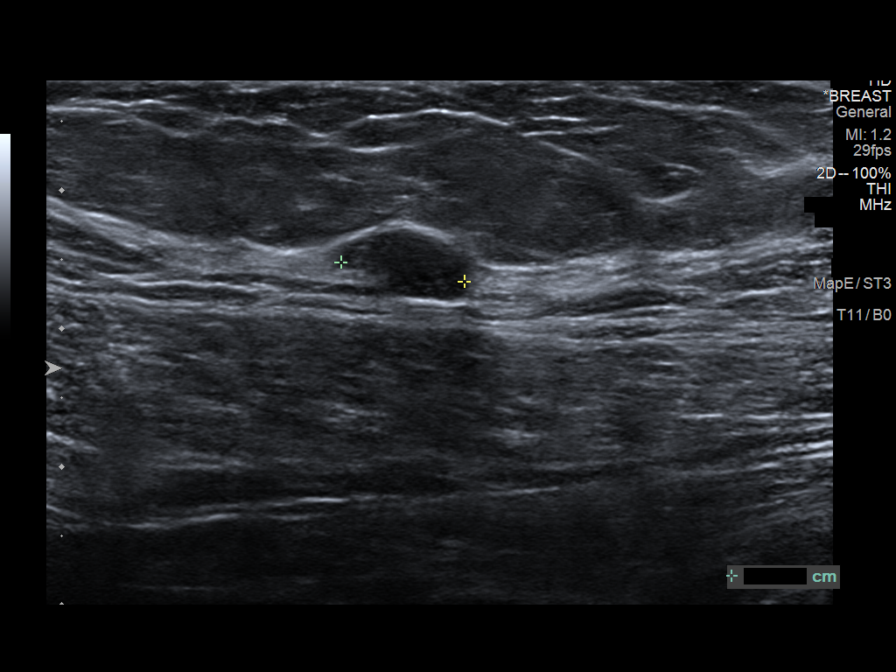
[im 6/7]
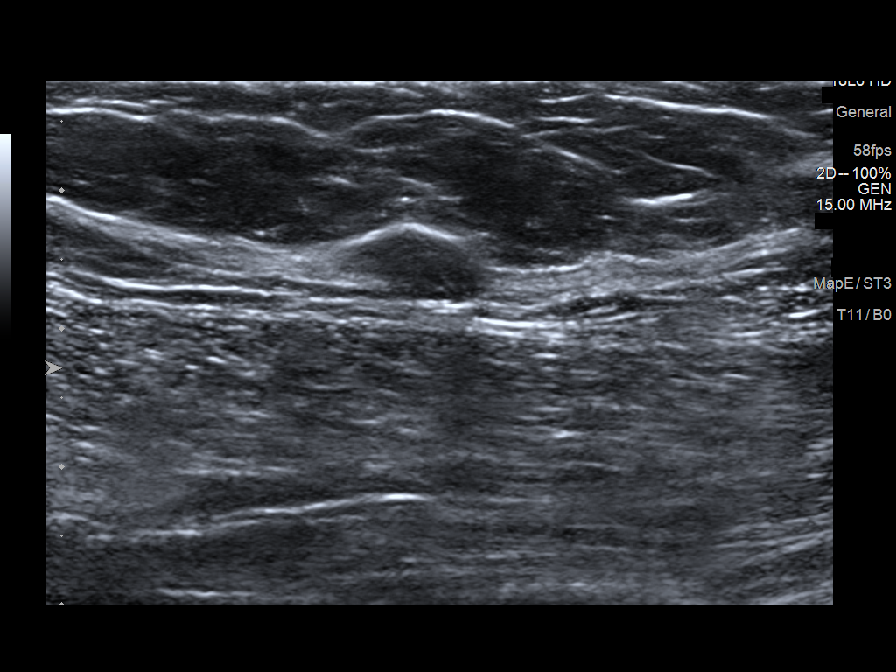
[im 7/7]
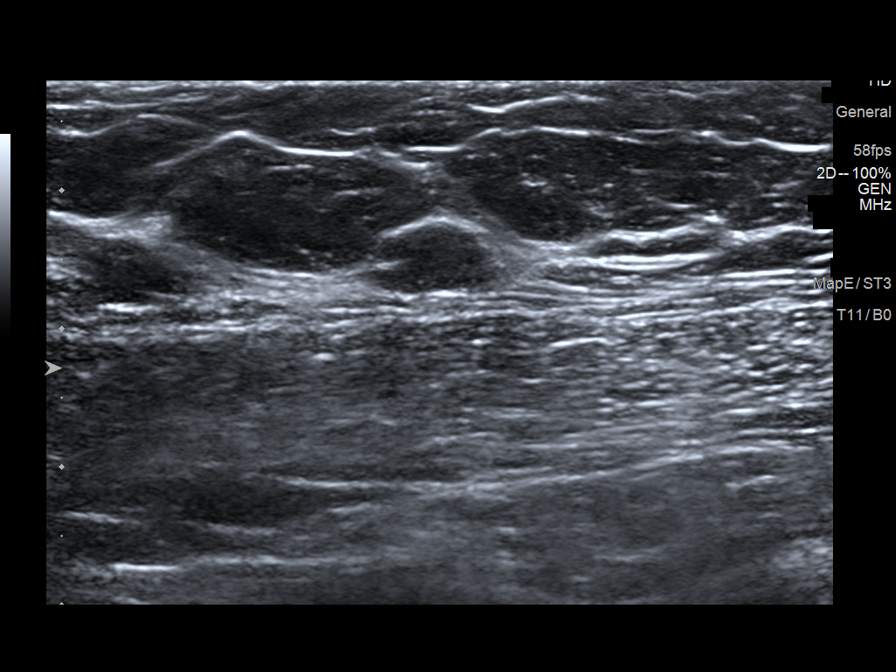

[7 of 7 positions shown; findings below may reference images not displayed]

ACR Breast Density Category c: The breast tissue is heterogeneously
dense, which may obscure small masses.
FINDINGS: Additional 2-D and 3-D images are performed. Views confirm presence
of a circumscribed round medium density mass in the upper central
portion of the left breast, further evaluated sonographically.

Mammographic images were processed with CAD.

Targeted ultrasound is performed, showing a circumscribed oval
parallel homogeneously hypoechoic mass associated with posterior
acoustic enhancement in the 11:30 o'clock location of the left
breast 7 cm from nipple which measures 0.9 x 1.0 x 0.5 cm. Findings
are consistent with benign fibroadenoma.
IMPRESSION: Persistent abnormality in the upper portion of the left breast is
consistent with the previously biopsied fibroadenoma. No
mammographic or ultrasound evidence for malignancy.

RECOMMENDATION:
Screening mammogram in one year.(Code:LF-Y-TMG)

I have discussed the findings and recommendations with the patient.
Results were also provided in writing at the conclusion of the
visit. If applicable, a reminder letter will be sent to the patient
regarding the next appointment.

BI-RADS CATEGORY  2: Benign.

## 2019-02-21 ENCOUNTER — Other Ambulatory Visit: Payer: Self-pay | Admitting: Internal Medicine

## 2019-02-21 DIAGNOSIS — Z1231 Encounter for screening mammogram for malignant neoplasm of breast: Secondary | ICD-10-CM

## 2019-03-30 ENCOUNTER — Other Ambulatory Visit: Payer: Self-pay | Admitting: Obstetrics

## 2019-03-30 DIAGNOSIS — Z3041 Encounter for surveillance of contraceptive pills: Secondary | ICD-10-CM
